# Patient Record
Sex: Female | Born: 1990 | Race: Black or African American | Hispanic: No | Marital: Single | Smoking: Never smoker
Health system: Southern US, Community
[De-identification: ages and names within clinical notes are randomized; demographics above are authoritative.]

## PROBLEM LIST (undated history)

## (undated) HISTORY — PX: BACK SURGERY: SHX140

## (undated) HISTORY — PX: FEMUR FRACTURE SURGERY: SHX633

## (undated) HISTORY — PX: ANKLE SURGERY: SHX546

---

## 2015-07-13 ENCOUNTER — Emergency Department (HOSPITAL_COMMUNITY)
Admission: EM | Admit: 2015-07-13 | Discharge: 2015-07-14 | Disposition: A | Discharge status: Home / Self Care | Payer: Self-pay | Attending: Emergency Medicine | Admitting: Emergency Medicine

## 2015-07-13 ENCOUNTER — Encounter (HOSPITAL_COMMUNITY): Payer: Self-pay | Admitting: Oncology

## 2015-07-13 DIAGNOSIS — S01512A Laceration without foreign body of oral cavity, initial encounter: Secondary | ICD-10-CM | POA: Insufficient documentation

## 2015-07-13 DIAGNOSIS — Z3202 Encounter for pregnancy test, result negative: Secondary | ICD-10-CM | POA: Insufficient documentation

## 2015-07-13 DIAGNOSIS — S0990XA Unspecified injury of head, initial encounter: Secondary | ICD-10-CM | POA: Insufficient documentation

## 2015-07-13 DIAGNOSIS — Y998 Other external cause status: Secondary | ICD-10-CM | POA: Insufficient documentation

## 2015-07-13 DIAGNOSIS — S0083XA Contusion of other part of head, initial encounter: Secondary | ICD-10-CM

## 2015-07-13 DIAGNOSIS — S20212A Contusion of left front wall of thorax, initial encounter: Secondary | ICD-10-CM | POA: Insufficient documentation

## 2015-07-13 DIAGNOSIS — Y9389 Activity, other specified: Secondary | ICD-10-CM | POA: Insufficient documentation

## 2015-07-13 DIAGNOSIS — Y9289 Other specified places as the place of occurrence of the external cause: Secondary | ICD-10-CM | POA: Insufficient documentation
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
## 2015-07-14 ENCOUNTER — Emergency Department (HOSPITAL_COMMUNITY): Payer: Self-pay

## 2015-07-14 LAB — URINALYSIS, ROUTINE W REFLEX MICROSCOPIC
BILIRUBIN URINE: NEGATIVE
Glucose, UA: NEGATIVE mg/dL
Ketones, ur: NEGATIVE mg/dL
Leukocytes, UA: NEGATIVE
NITRITE: NEGATIVE
PROTEIN: NEGATIVE mg/dL
Specific Gravity, Urine: 1.029 (ref 1.005–1.030)
pH: 6 (ref 5.0–8.0)

## 2015-07-14 LAB — URINE MICROSCOPIC-ADD ON

## 2015-07-14 LAB — POC URINE PREG, ED: PREG TEST UR: NEGATIVE

## 2015-07-14 MED ORDER — TRAMADOL HCL 50 MG PO TABS: 50.0000 mg | ORAL_TABLET | Freq: Four times a day (QID) | ORAL | Status: AC | PRN
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
## 2015-07-14 NOTE — Discharge Instructions (Signed)
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
